# Patient Record
Sex: Female | Born: 1970
Health system: Southern US, Community
[De-identification: ages and names within clinical notes are randomized; demographics above are authoritative.]

---

## 1999-04-19 ENCOUNTER — Ambulatory Visit (HOSPITAL_BASED_OUTPATIENT_CLINIC_OR_DEPARTMENT_OTHER): Admission: RE | Admit: 1999-04-19 | Discharge: 1999-04-19 | Payer: Self-pay | Admitting: Urology

## 1999-12-03 ENCOUNTER — Ambulatory Visit (HOSPITAL_COMMUNITY): Admission: RE | Admit: 1999-12-03 | Discharge: 1999-12-03 | Payer: Self-pay | Admitting: Family Medicine

## 1999-12-03 ENCOUNTER — Encounter: Payer: Self-pay | Admitting: Family Medicine

## 2000-02-19 ENCOUNTER — Encounter (INDEPENDENT_AMBULATORY_CARE_PROVIDER_SITE_OTHER): Payer: Self-pay | Admitting: Specialist

## 2000-02-19 ENCOUNTER — Other Ambulatory Visit: Admission: RE | Admit: 2000-02-19 | Discharge: 2000-02-19 | Payer: Self-pay | Admitting: Obstetrics and Gynecology

## 2001-02-18 ENCOUNTER — Other Ambulatory Visit: Admission: RE | Admit: 2001-02-18 | Discharge: 2001-02-18 | Payer: Self-pay | Admitting: Obstetrics and Gynecology

## 2002-02-22 ENCOUNTER — Other Ambulatory Visit: Admission: RE | Admit: 2002-02-22 | Discharge: 2002-02-22 | Payer: Self-pay | Admitting: Obstetrics and Gynecology

## 2003-02-27 ENCOUNTER — Other Ambulatory Visit: Admission: RE | Admit: 2003-02-27 | Discharge: 2003-02-27 | Payer: Self-pay | Admitting: Obstetrics and Gynecology

## 2003-06-08 ENCOUNTER — Other Ambulatory Visit: Admission: RE | Admit: 2003-06-08 | Discharge: 2003-06-08 | Payer: Self-pay | Admitting: Obstetrics and Gynecology

## 2003-12-13 ENCOUNTER — Inpatient Hospital Stay (HOSPITAL_COMMUNITY): Admission: AD | Admit: 2003-12-13 | Discharge: 2003-12-16 | Payer: Self-pay | Admitting: Obstetrics and Gynecology

## 2003-12-17 ENCOUNTER — Encounter: Admission: RE | Admit: 2003-12-17 | Discharge: 2004-01-16 | Payer: Self-pay | Admitting: Obstetrics and Gynecology

## 2004-01-17 ENCOUNTER — Other Ambulatory Visit: Admission: RE | Admit: 2004-01-17 | Discharge: 2004-01-17 | Payer: Self-pay | Admitting: Obstetrics and Gynecology

## 2004-01-17 ENCOUNTER — Encounter: Admission: RE | Admit: 2004-01-17 | Discharge: 2004-02-16 | Payer: Self-pay | Admitting: Obstetrics and Gynecology

## 2005-02-10 ENCOUNTER — Other Ambulatory Visit: Admission: RE | Admit: 2005-02-10 | Discharge: 2005-02-10 | Payer: Self-pay | Admitting: Obstetrics and Gynecology

## 2008-02-17 ENCOUNTER — Ambulatory Visit: Payer: Self-pay | Admitting: Sports Medicine

## 2008-02-17 DIAGNOSIS — M25559 Pain in unspecified hip: Secondary | ICD-10-CM | POA: Insufficient documentation

## 2008-06-24 ENCOUNTER — Ambulatory Visit: Payer: Self-pay | Admitting: Family Medicine

## 2008-06-24 DIAGNOSIS — J0100 Acute maxillary sinusitis, unspecified: Secondary | ICD-10-CM | POA: Insufficient documentation

## 2008-09-05 ENCOUNTER — Ambulatory Visit: Payer: Self-pay | Admitting: Family Medicine

## 2008-09-05 ENCOUNTER — Telehealth: Payer: Self-pay | Admitting: Family Medicine

## 2008-09-05 DIAGNOSIS — J329 Chronic sinusitis, unspecified: Secondary | ICD-10-CM | POA: Insufficient documentation

## 2009-01-04 ENCOUNTER — Ambulatory Visit: Payer: Self-pay | Admitting: Family Medicine

## 2009-01-04 DIAGNOSIS — J069 Acute upper respiratory infection, unspecified: Secondary | ICD-10-CM | POA: Insufficient documentation

## 2009-01-09 ENCOUNTER — Ambulatory Visit: Payer: Self-pay | Admitting: Family Medicine

## 2010-10-04 NOTE — Discharge Summary (Signed)
NAMETowanda Callahan                         ACCOUNT NO.:  [id number]   MEDICAL RECORD NO.:  [id number]                   PATIENT TYPE:  INP   LOCATION:  9129                                 FACILITY:  [facility]   PHYSICIAN:  Guy Sandifer. Arleta Creek, M.D.           DATE OF BIRTH:  18-May-1971   DATE OF ADMISSION:  12/13/2003  DATE OF DISCHARGE:  12/16/2003                                 DISCHARGE SUMMARY   ADMISSION DIAGNOSIS:  Intrauterine pregnancy at 39-1/7 weeks.   DISCHARGE DIAGNOSES:  1. Intrauterine pregnancy at 39-1/7 weeks.  2. Failure to progress.   PROCEDURE:  On December 13, 2003, low transverse Cesarean section.   REASON FOR ADMISSION:  The patient is a 40 year old married white female,  G1, P0, admitted with rupture of membranes. She progresses to 3 cm and 75%  effaced.   HOSPITAL COURSE:  The patient was taken to the operating room and undergoes  the above procedure for a viable female infant. On the first postoperative  day, she was afebrile, vital signs were stable. Hemoglobin 11.5, white count  14.3. She had good progression of bowel function, ambulation, and voiding.  She remained afebrile with stable vital signs.   CONDITION ON DISCHARGE:  Good.   DIET:  Regular as tolerated.   ACTIVITY:  No lifting; no operation of automobiles; no vaginal entry.   FOLLOWUP:  She is to call the office for problems including but not limited  to temperature of 101 degrees, persistent nausea and vomiting, heavy vaginal  bleeding, or increasing pain. Follow up in the office in two weeks.   MEDICATIONS:  1. Percocet 5/325 mg, #30, one to two p.o. q.6h p.r.n.  2. Ibuprofen 600 mg q.6h. p.r.n.  3. Vitamin daily.                                               Guy Sandifer Arleta Creek, M.D.    JET/MEDQ  D:  12/16/2003  T:  12/16/2003  Job:  [id number]

## 2010-10-04 NOTE — Op Note (Signed)
NAMETowanda Callahan                         ACCOUNT NO.:  [id number]   MEDICAL RECORD NO.:  [id number]                   PATIENT TYPE:  INP   LOCATION:  9129                                 FACILITY:  [facility]   PHYSICIAN:  Dineen Kid. Rana Snare, M.D.                 DATE OF BIRTH:  10-Apr-1971   DATE OF PROCEDURE:  12/14/2003  DATE OF DISCHARGE:                                 OPERATIVE REPORT   PREOPERATIVE DIAGNOSIS:  Intrauterine pregnancy at 39 weeks with failure to  progress.   POSTOPERATIVE DIAGNOSES:  1. Intrauterine pregnancy at 39 weeks with failure to progress.  2. Occiput posterior presentation.   PROCEDURE:  Primary low segment transverse cesarean section.   SURGEON:  Dineen Kid. Rana Snare, M.D.   ANESTHESIA:  Epidural.   ESTIMATED BLOOD LOSS:  800 mL.   INDICATIONS:  [name] is a 40 year old G1 who presented at 39-1/2 weeks  for induction of labor due to anxiety regarding a large baby.  She had an  ultrasound last week that showed a 7-1/2 pound fetus.  Pregnancy has  otherwise been uncomplicated.  She does have a history of HSV.  Last  outbreak was five weeks ago.  No recent symptoms, and exam upon presentation  was without evidence of active lesions.   HOSPITAL COURSE:  The patient underwent amniotomy and Pitocin induction of  labor.  She progressed to 3 cm and 75%.  Despite adequate labor for greater  than four hours, she never progressed beyond 3 cm, and we proceeded with  primary low segment transverse cesarean section for failure to progress.  The risks and benefits were discussed and formal consent was obtained.   Findings at the time of surgery were a viable female infant, Apgars were 9  and 9, weight is 8 pounds 3 ounces, pH arterial is currently pending.   DESCRIPTION OF PROCEDURE:  After adequate analgesia, the patient was placed  in the supine position with a left lateral tilt.  She was sterilely prepped  and draped.  The bladder was sterilely drained with a  Foley catheter.  A  Pfannenstiel skin incision was made two fingerbreadths above the pubic  symphysis, taken down sharply to the fascia, which was incised transversely,  extended superiorly and inferiorly off the bellies of the rectus muscle,  which were separated sharply in the midline.  The peritoneum was entered  sharply, a bladder flap was created, placed behind the bladder blade.  A low  segment myotomy incision was made down to the infant's vertex, extended  laterally with the operator's fingertips.  The infant's vertex was delivered  atraumatically, the nares and pharynx suctioned.  The infant was then  delivered, cord clamped, cut, handed to the pediatricians for resuscitation.  Cord blood was obtained, placenta extracted manually.  The uterus was  exteriorized, wiped clean with a dry lap.  The myotomy incision was closed  with two  sutures of 0 Monocryl, the first being a running locking layer, the  second being an imbricating layer, with good hemostasis achieved.  The  uterus was placed back in the peritoneal cavity.  After a copious amount of  irrigation and adequate hemostasis was assured, the peritoneum was closed  with 0 Monocryl, rectus muscle plicated in the midline, irrigation was  applied.  After adequate hemostasis, the fascia was closed with #1 Vicryl in  a running fashion.  Irrigation of the skin applied and after adequate  hemostasis, the skin was stapled, Steri-Strips applied.  The patient  tolerated the procedure well, was stable on transfer to the recovery room.  The sponge and instrument count was normal x3.  The estimated blood loss for  the procedure was 800 mL.  The patient received Rocephin 1 g after delivery  of the placenta.                                               Dineen Kid Rana Snare, M.D.    DCL/MEDQ  D:  12/14/2003  T:  12/14/2003  Job:  [id number]

## 2014-03-16 ENCOUNTER — Emergency Department (HOSPITAL_COMMUNITY)
Admission: EM | Admit: 2014-03-16 | Discharge: 2014-03-16 | Disposition: A | Discharge status: Home / Self Care | Payer: Worker's Compensation | Attending: Emergency Medicine | Admitting: Emergency Medicine

## 2014-03-16 ENCOUNTER — Encounter (HOSPITAL_COMMUNITY): Payer: Self-pay | Admitting: Emergency Medicine

## 2014-03-16 DIAGNOSIS — Y9289 Other specified places as the place of occurrence of the external cause: Secondary | ICD-10-CM | POA: Insufficient documentation

## 2014-03-16 DIAGNOSIS — Z79899 Other long term (current) drug therapy: Secondary | ICD-10-CM | POA: Diagnosis not present

## 2014-03-16 DIAGNOSIS — X58XXXA Exposure to other specified factors, initial encounter: Secondary | ICD-10-CM | POA: Insufficient documentation

## 2014-03-16 DIAGNOSIS — S86911A Strain of unspecified muscle(s) and tendon(s) at lower leg level, right leg, initial encounter: Secondary | ICD-10-CM | POA: Insufficient documentation

## 2014-03-16 DIAGNOSIS — S8991XA Unspecified injury of right lower leg, initial encounter: Secondary | ICD-10-CM | POA: Diagnosis present

## 2014-03-16 DIAGNOSIS — Z7951 Long term (current) use of inhaled steroids: Secondary | ICD-10-CM | POA: Insufficient documentation

## 2014-03-16 DIAGNOSIS — S99911A Unspecified injury of right ankle, initial encounter: Secondary | ICD-10-CM | POA: Insufficient documentation

## 2014-03-16 DIAGNOSIS — Y9301 Activity, walking, marching and hiking: Secondary | ICD-10-CM | POA: Diagnosis not present

## 2014-03-16 DIAGNOSIS — S86811A Strain of other muscle(s) and tendon(s) at lower leg level, right leg, initial encounter: Secondary | ICD-10-CM

## 2014-03-16 MED ORDER — HYDROCODONE-ACETAMINOPHEN 5-325 MG PO TABS: 1.0000 | ORAL_TABLET | Freq: Four times a day (QID) | ORAL | Status: DC | PRN

## 2014-03-16 MED ORDER — HYDROCODONE-ACETAMINOPHEN 5-325 MG PO TABS
2.0000 | ORAL_TABLET | Freq: Once | ORAL | Status: AC
  Administered 2014-03-16: 2 via ORAL
  Filled 2014-03-16: qty 2

## 2014-03-16 MED ORDER — IBUPROFEN 800 MG PO TABS: 800.0000 mg | ORAL_TABLET | Freq: Three times a day (TID) | ORAL | Status: DC

## 2014-03-16 MED ORDER — IBUPROFEN 800 MG PO TABS
800.0000 mg | ORAL_TABLET | Freq: Once | ORAL | Status: AC
  Administered 2014-03-16: 800 mg via ORAL
  Filled 2014-03-16: qty 1

## 2014-03-16 NOTE — ED Notes (Signed)
AVS explained in detail. Knows not to drink/drive/operate heavy machinery with [employer]. No other questions/concerns. Acknowledges understanding to follow up with orthopedics in one week. Ace wrap intact. Knows appropriate use of crutches.

## 2014-03-16 NOTE — ED Notes (Signed)
Pt from work. Per EMS pt was walking backward and got imbalanced which cause fall and right leg injury. Pt presents with calf edema and pain. Pt denies syncope or head injury.

## 2014-03-16 NOTE — Discharge Instructions (Signed)
Medial Head Gastrocnemius Tear (Tennis Leg), with Rehab Medial head gastrocnemius tear, also called tennis leg, is a tear (strain) in a muscle or tendon of the inner portion (medial head) of one of the calf muscles (gastrocnemius). The inner portion of the calf muscle attaches to the thigh bone (femur) and is responsible for bending the knee and straightening the foot (standing "on tiptoe"). Strains are classified into three categories. Grade 1 strains cause pain, but the tendon is not lengthened. Grade 2 strains include a lengthened ligament, due to the ligament being stretched or partially ruptured. With grade 2 strains there is still function, although function may be decreased. Grade 3 strains involve a complete tear of the tendon or muscle, and function is usually impaired. SYMPTOMS   Sudden "pop" or tear felt at the time of injury.  Pain, tenderness, swelling, warmth, or redness over the middle inner calf.  Pain and weakness with ankle motion, especially flexing the ankle against resistance, as well as pain with lifting up the foot (extending the ankle).  Bruising (contusion) of the calf, heel, and sometimes the foot within 48 hours of injury.  Muscle spasm in the calf. CAUSES  Muscle and ligament strains occur when a force is placed on the muscle or ligament that is greater than it can handle. Common causes of injury include:  Direct hit (trauma) to the calf.  Sudden forceful pushing off or landing on the foot (jumping, landing, serving a tennis ball, lunging). RISK INCREASES WITH:  Sports that require sudden, explosive calf muscle contraction, such as those involving jumping (basketball), hill running, quick starts (running), or lunging (racquetball, tennis).  Contact sports (football, soccer, hockey).  Poor strength and flexibility.  Previous lower limb injury. PREVENTION  Warm up and stretch properly before activity.  Allow for adequate recovery between workouts.  Maintain  physical fitness:  Strength, flexibility, and endurance.  Cardiovascular fitness.  Learn and use proper exercise technique.  Complete rehabilitation after lower limb injury, before returning to competition or practice. PROGNOSIS  If treated properly, tennis leg usually heals within 6 weeks of nonsurgical treatment.  RELATED COMPLICATIONS   Longer healing time, if not properly treated or if not given enough time to heal.  Recurring symptoms and injury, if activity is resumed too soon, with overuse, with a direct blow, or with poor technique.  If untreated, may progress to a complete tear (rare) or other injury, due to limping and favoring of the injured leg.  Persistent limping, due to scarring and shortening of the calf muscles, as a result of inadequate rehabilitation.  Prolonged disability. TREATMENT  Treatment first involves the use of ice and medication to help reduce pain and inflammation. The use of strengthening and stretching exercises may help reduce pain with activity. These exercises may be performed at home or with a therapist. For severe injuries, referral to a therapist may be needed for further evaluation and treatment. Your caregiver may advise that you wear a brace to help healing. Sometimes, crutches are needed until you can walk without limping. Rarely, surgery is needed.  MEDICATION   If pain medicine is needed, nonsteroidal anti-inflammatory medicines (aspirin and ibuprofen), or other minor pain relievers (acetaminophen), are often advised.  Do not take pain medicine for 7 days before surgery.  Prescription pain relievers may be given, if your caregiver thinks they are needed. Use only as directed and only as much as you need. HEAT AND COLD  Cold treatment (icing) should be applied for 10 to 15  minutes every 2 to 3 hours for inflammation and pain, and immediately after activity that aggravates your symptoms. Use ice packs or an ice massage.  Heat treatment may  be used before performing stretching and strengthening activities prescribed by your caregiver, physical therapist, or athletic trainer. Use a heat pack or a warm water soak. SEEK MEDICAL CARE IF:   Symptoms get worse or do not improve in 2 weeks, despite treatment.  Numbness or tingling develops.  New, unexplained symptoms develop. (Drugs used in treatment may produce side effects.) EXERCISES  RANGE OF MOTION (ROM) AND STRETCHING EXERCISES - Medial Head Gastrocnemius Tear (Tennis Leg) These exercises may help you when beginning to rehabilitate your injury. Your symptoms may resolve with or without further involvement from your physician, physical therapist, or athletic trainer. While completing these exercises, remember:   Restoring tissue flexibility helps normal motion to return to the joints. This allows healthier, less painful movement and activity.  An effective stretch should be held for at least 30 seconds.  A stretch should never be painful. You should only feel a gentle lengthening or release in the stretched tissue. STRETCH - Gastrocsoleus  Sit with your right / left leg extended. Holding onto both ends of a belt or towel, loop it around the ball of your foot.  Keeping your right / left ankle and foot relaxed and your knee straight, pull your foot and ankle toward you using the belt.  You should feel a gentle stretch behind your calf or knee. Hold this position for __________ seconds. Repeat __________ times. Complete this stretch __________ times per day.  RANGE OF MOTION - Ankle Dorsiflexion, Active Assisted   Remove your shoes and sit on a chair, preferably not on a carpeted surface.  Place your right / left foot directly under the knee. Extend your opposite leg for support.  Keeping your heel down, slide your right / left foot back toward the chair, until you feel a stretch at your ankle or calf. If you do not feel a stretch, slide your bottom forward to the edge of the  chair, while still keeping your heel down.  Hold this stretch for __________ seconds. Repeat __________ times. Complete this stretch __________ times per day.  STRETCH - Gastroc, Standing   Place your hands on a wall.  Extend your right / left leg behind you, keeping the front knee somewhat bent.  Slightly point your toes inward on your back foot.  Keeping your right / left heel on the floor and your knee straight, shift your weight toward the wall, not allowing your back to arch.  You should feel a gentle stretch in the right / left calf. Hold this position for __________ seconds. Repeat __________ times. Complete this stretch __________ times per day. STRETCH - Soleus, Standing   Place your hands on a wall.  Extend your right / left leg behind you, keeping the other knee somewhat bent.  Point your toes of your back foot slightly inward.  Keep your right / left heel on the floor, bend your back knee, and slightly shift your weight over the back leg so that you feel a gentle stretch deep in your back calf.  Hold this position for __________ seconds. Repeat __________ times. Complete this stretch __________ times per day. STRETCH - Gastrocsoleus, Standing Note: This exercise can place a lot of stress on your foot and ankle. Please complete this exercise only if specifically instructed by your caregiver.   Place the ball of  your right / left foot on a step, keeping your other foot firmly on the same step.  Hold on to the wall or a rail for balance.  Slowly lift your other foot, allowing your body weight to press your heel down over the edge of the step.  You should feel a stretch in your right / left calf.  Hold this position for __________ seconds.  Repeat this exercise with a slight bend in your right / left knee. Repeat __________ times. Complete this stretch __________ times per day.  STRENGTHENING EXERCISES - Medial Head Gastrocnemius Tear (Tennis Leg) These exercises  may help you when beginning to rehabilitate your injury. They may resolve your symptoms with or without further involvement from your physician, physical therapist, or athletic trainer. While completing these exercises, remember:   Muscles can gain both the endurance and the strength needed for everyday activities through controlled exercises.  Complete these exercises as instructed by your physician, physical therapist, or athletic trainer. Increase the resistance and repetitions only as guided by your caregiver. STRENGTH - Plantar-flexors  Sit with your right / left leg extended. Holding onto both ends of a rubber exercise band or tubing, loop it around the ball of your foot. Keep a slight tension in the band.  Slowly push your toes away from you, pointing them downward.  Hold this position for __________ seconds. Return slowly, controlling the tension in the band. Repeat __________ times. Complete this exercise __________ times per day.  STRENGTH - Plantar-flexors  Stand with your feet shoulder width apart. Steady yourself with a wall or table, using as little support as needed.  Keeping your weight evenly spread over the width of your feet, rise up on your toes.*  Hold this position for __________ seconds. Repeat __________ times. Complete this exercise __________ times per day.  *If this is too easy, shift your weight toward your right / left leg until you feel challenged. Ultimately, you may be asked to do this exercise while standing on your right / left foot only. STRENGTH - Plantar-flexors, Eccentric Note: This exercise can place a lot of stress on your foot and ankle. Please complete this exercise only if specifically instructed by your caregiver.   Place the balls of your feet on a step. With your hands, use only enough support from a wall or rail to keep your balance.  Keep your knees straight and rise up on your toes.  Slowly shift your weight entirely to your right / left  toes and pick up your opposite foot. Gently and with controlled movement, lower your weight through your right / left foot so that your heel drops below the level of the step. You will feel a slight stretch in the back of your right / left calf.  Use the healthy leg to help rise up onto the balls of both feet, then lower weight only onto the right / left leg again. Build up to 15 repetitions. Then progress to 3 sets of 15 repetitions.*  After completing the above exercise, complete the same exercise with a slight knee bend (about 30 degrees). Again, build up to 15 repetitions. Then progress to 3 sets of 15 repetitions.* Perform this exercise __________ times per day.  *When you easily complete 3 sets of 15, your physician, physical therapist, or athletic trainer may advise you to add resistance, by wearing a backpack filled with additional weight. Document Released: 05/05/2005 Document Revised: 09/19/2013 Document Reviewed: 08/17/2008 [facility] Patient Information 2015 [place], [place]. This  information is not intended to replace advice given to you by your health care provider. Make sure you discuss any questions you have with your health care provider.  Ligament Sprain A ligament sprain is when the bands of tissue that hold bones together (ligament) are stretched. HOME CARE   Rest the injured area.  Start using the joint when told to by your doctor.  Keep the injured area raised (elevated) above the level of the heart. This may lessen puffiness (swelling).  Put ice on the injured area.  Put ice in a plastic bag.  Place a towel between your skin and the bag.  Leave the ice on for 15-20 minutes, 03-04 times a day.  Wear a splint, cast, or an elastic bandage as told by your doctor.  Only take medicine as told by your doctor.  Use crutches as told by your doctor. Do not put weight on the injured joint until told to by your doctor. GET HELP RIGHT AWAY IF:   You have more bruising,  puffiness, or pain.  The leg was injured and the toes are cold, tingling, numb, or blue.  The arm was injured and the fingers are cold, tingling, numb, or blue.  The pain is not helped with medicine.  The pain gets worse. MAKE SURE YOU:   Understand these instructions.  Will watch this condition.  Will get help right away if you are not doing well or get worse. Document Released: 10/22/2007 Document Revised: 02/23/2013 Document Reviewed: 10/22/2007 [facility] Patient Information 2015 [place], [place]. This information is not intended to replace advice given to you by your health care provider. Make sure you discuss any questions you have with your health care provider.  Muscle Strain A muscle strain is an injury that occurs when a muscle is stretched beyond its normal length. Usually a small number of muscle fibers are torn when this happens. Muscle strain is rated in degrees. First-degree strains have the least amount of muscle fiber tearing and pain. Second-degree and third-degree strains have increasingly more tearing and pain.  Usually, recovery from muscle strain takes 1-2 weeks. Complete healing takes 5-6 weeks.  CAUSES  Muscle strain happens when a sudden, violent force placed on a muscle stretches it too far. This may occur with lifting, sports, or a fall.  RISK FACTORS Muscle strain is especially common in athletes.  SIGNS AND SYMPTOMS At the site of the muscle strain, there may be:  Pain.  Bruising.  Swelling.  Difficulty using the muscle due to pain or lack of normal function. DIAGNOSIS  Your health care provider will perform a physical exam and ask about your medical history. TREATMENT  Often, the best treatment for a muscle strain is resting, icing, and applying cold compresses to the injured area.  HOME CARE INSTRUCTIONS   Use the PRICE method of treatment to promote muscle healing during the first 2-3 days after your injury. The PRICE method  involves:  Protecting the muscle from being injured again.  Restricting your activity and resting the injured body part.  Icing your injury. To do this, put ice in a plastic bag. Place a towel between your skin and the bag. Then, apply the ice and leave it on from 15-20 minutes each hour. After the third day, switch to moist heat packs.  Apply compression to the injured area with a splint or elastic bandage. Be careful not to wrap it too tightly. This may interfere with blood circulation or increase swelling.  Elevate the  injured body part above the level of your heart as often as you can.  Only take over-the-counter or prescription medicines for pain, discomfort, or fever as directed by your health care provider.  Warming up prior to exercise helps to prevent future muscle strains. SEEK MEDICAL CARE IF:   You have increasing pain or swelling in the injured area.  You have numbness, tingling, or a significant loss of strength in the injured area. MAKE SURE YOU:   Understand these instructions.  Will watch your condition.  Will get help right away if you are not doing well or get worse. Document Released: 05/05/2005 Document Revised: 02/23/2013 Document Reviewed: 12/02/2012 [facility] Patient Information 2015 [place], [place]. This information is not intended to replace advice given to you by your health care provider. Make sure you discuss any questions you have with your health care provider.

## 2014-03-16 NOTE — ED Provider Notes (Signed)
CSN: [id number]     Arrival date & time 03/16/14  1417 History   First MD Initiated Contact with Patient 03/16/14 1457     Chief Complaint  Patient presents with  . Fall    right leg injury     (Consider location/radiation/quality/duration/timing/severity/associated sxs/prior Treatment) HPI Comments: Walking backwards and felt a pop in her R calf. She then fell to the ground. Having severe upper R calf pain in the musculature.  Patient is a 43 y.o. female presenting with fall.  Fall This is a new problem. The current episode started 1 to 2 hours ago. The problem occurs constantly. The problem has not changed since onset.Pertinent negatives include no chest pain, no abdominal pain and no shortness of breath. Nothing aggravates the symptoms. Nothing relieves the symptoms. She has tried nothing for the symptoms.    History reviewed. No pertinent past medical history. No past surgical history on file. No family history on file. History  Substance Use Topics  . Smoking status: Not on file  . Smokeless tobacco: Not on file  . Alcohol Use: Not on file   OB History   Grav Para Term Preterm Abortions TAB SAB Ect Mult Living                 Review of Systems  Constitutional: Negative for fever and chills.  Respiratory: Negative for cough and shortness of breath.   Cardiovascular: Negative for chest pain and leg swelling.  Gastrointestinal: Negative for vomiting and abdominal pain.  All other systems reviewed and are negative.     Allergies  Metronidazole  Home Medications   Prior to Admission medications   Medication Sig Start Date End Date Taking? Authorizing Provider  B Complex-C (SUPER B COMPLEX PO) Take 1 tablet by mouth every evening.   Yes Historical Provider, MD  Fexofenadine HCl (ALLEGRA PO) Take 1 tablet by mouth every evening.   Yes Historical Provider, MD  mometasone (NASONEX) 50 MCG/ACT nasal spray Place 1-2 sprays into the nose every evening.   Yes Historical  Provider, MD  Multiple Vitamin (MULTIVITAMIN) tablet Take 1 tablet by mouth daily.     Yes Historical Provider, MD  Norethindrone Acet-Ethinyl Est (LOESTRIN 1.5/30, 21,) 1.5-30 MG-MCG TABS Take 1 tablet by mouth every evening.    Yes Historical Provider, MD  sertraline (ZOLOFT) 100 MG tablet Take 150 mg by mouth at bedtime.    Yes Historical Provider, MD  valACYclovir (VALTREX) 1000 MG tablet Take 1,000 mg by mouth daily as needed.   Yes Historical Provider, MD   BP 125/61  Pulse 85  Temp(Src) 98.5 F (36.9 C) (Oral)  Resp 20  SpO2 98%  LMP 02/14/2014 Physical Exam  Nursing note and vitals reviewed. Constitutional: She is oriented to person, place, and time. She appears well-developed and well-nourished. No distress.  HENT:  Head: Normocephalic and atraumatic.  Mouth/Throat: Oropharynx is clear and moist.  Eyes: EOM are normal. Pupils are equal, round, and reactive to light.  Neck: Normal range of motion. Neck supple.  Cardiovascular: Normal rate and regular rhythm.  Exam reveals no friction rub.   No murmur heard. Pulmonary/Chest: Effort normal and breath sounds normal. No respiratory distress. She has no wheezes. She has no rales.  Abdominal: Soft. She exhibits no distension. There is no tenderness. There is no rebound.  Musculoskeletal: She exhibits no edema.       Right ankle: She exhibits decreased range of motion (due to calf pain). She exhibits no swelling, no ecchymosis,  no deformity, no laceration and normal pulse. No tenderness.       Right lower leg: She exhibits tenderness. She exhibits no bony tenderness, no swelling, no edema, no deformity and no laceration.  Neurological: She is alert and oriented to person, place, and time.  Skin: No rash noted. She is not diaphoretic.    ED Course  Procedures (including critical care time) Labs Review Labs Reviewed - No data to display  Imaging Review No results found.   EKG Interpretation None      MDM   Final  diagnoses:  Strain of calf muscle, right, initial encounter    56F here with symptoms concerning for right calf strain versus partial tear. She was walking backwards and felt a pop in her calf. She then fell to the ground. She did not hit her head or lose consciousness. Vitals are stable here. She has diffuse upper right calf musculature tenderness. She has noted mild decreased range of motion her right ankle due to the pain. She had negative Thompson test and no pain over the Achilles tendon. I believe her symptoms are consistent with calf strain. She will be unable to bear weight so will provide crutches. Also provide a compression sleeve. Counseled her on NSAIDs, follow up with physical therapy and orthopedics. Given small amount of pain medicine. Stable for discharge.    Elwin MochaBlair Macall Mccroskey, MD 03/16/14 [phone]

## 2014-03-16 NOTE — ED Notes (Signed)
Bed: [facility] Expected date:  Expected time:  Means of arrival:  Comments: 

## 2016-04-03 ENCOUNTER — Other Ambulatory Visit: Payer: Self-pay | Admitting: Orthopedic Surgery

## 2016-04-03 DIAGNOSIS — M533 Sacrococcygeal disorders, not elsewhere classified: Secondary | ICD-10-CM

## 2016-04-16 ENCOUNTER — Other Ambulatory Visit: Payer: Self-pay

## 2017-08-18 ENCOUNTER — Other Ambulatory Visit: Payer: Self-pay | Admitting: Obstetrics and Gynecology

## 2017-08-18 DIAGNOSIS — R928 Other abnormal and inconclusive findings on diagnostic imaging of breast: Secondary | ICD-10-CM

## 2017-08-21 ENCOUNTER — Ambulatory Visit
Admission: RE | Admit: 2017-08-21 | Discharge: 2017-08-21 | Disposition: A | Discharge status: Home / Self Care | Payer: 59 | Source: Ambulatory Visit | Attending: Obstetrics and Gynecology | Admitting: Obstetrics and Gynecology

## 2017-08-21 ENCOUNTER — Other Ambulatory Visit: Payer: Self-pay | Admitting: Obstetrics and Gynecology

## 2017-08-21 DIAGNOSIS — R921 Mammographic calcification found on diagnostic imaging of breast: Secondary | ICD-10-CM

## 2017-08-21 DIAGNOSIS — R928 Other abnormal and inconclusive findings on diagnostic imaging of breast: Secondary | ICD-10-CM

## 2018-02-23 ENCOUNTER — Ambulatory Visit
Admission: RE | Admit: 2018-02-23 | Discharge: 2018-02-23 | Disposition: A | Discharge status: Home / Self Care | Payer: BLUE CROSS/BLUE SHIELD | Source: Ambulatory Visit | Attending: Obstetrics and Gynecology | Admitting: Obstetrics and Gynecology

## 2018-02-23 ENCOUNTER — Other Ambulatory Visit: Payer: Self-pay | Admitting: Obstetrics and Gynecology

## 2018-02-23 DIAGNOSIS — R921 Mammographic calcification found on diagnostic imaging of breast: Secondary | ICD-10-CM | POA: Diagnosis not present

## 2018-02-23 DIAGNOSIS — Z23 Encounter for immunization: Secondary | ICD-10-CM | POA: Diagnosis not present

## 2018-04-09 DIAGNOSIS — L02213 Cutaneous abscess of chest wall: Secondary | ICD-10-CM | POA: Diagnosis not present

## 2018-09-02 ENCOUNTER — Other Ambulatory Visit: Payer: Self-pay

## 2018-09-02 ENCOUNTER — Ambulatory Visit
Admission: RE | Admit: 2018-09-02 | Discharge: 2018-09-02 | Disposition: A | Discharge status: Home / Self Care | Payer: BC Managed Care – PPO | Source: Ambulatory Visit | Attending: Obstetrics and Gynecology | Admitting: Obstetrics and Gynecology

## 2018-09-02 DIAGNOSIS — R921 Mammographic calcification found on diagnostic imaging of breast: Secondary | ICD-10-CM

## 2019-01-03 DIAGNOSIS — Z6831 Body mass index (BMI) 31.0-31.9, adult: Secondary | ICD-10-CM | POA: Diagnosis not present

## 2019-01-03 DIAGNOSIS — Z01419 Encounter for gynecological examination (general) (routine) without abnormal findings: Secondary | ICD-10-CM | POA: Diagnosis not present

## 2019-04-05 DIAGNOSIS — Z20828 Contact with and (suspected) exposure to other viral communicable diseases: Secondary | ICD-10-CM | POA: Diagnosis not present

## 2019-05-18 DIAGNOSIS — Z0000 Encounter for general adult medical examination without abnormal findings: Secondary | ICD-10-CM | POA: Diagnosis not present

## 2019-05-26 DIAGNOSIS — E7800 Pure hypercholesterolemia, unspecified: Secondary | ICD-10-CM | POA: Diagnosis not present

## 2019-05-26 DIAGNOSIS — Z23 Encounter for immunization: Secondary | ICD-10-CM | POA: Diagnosis not present

## 2019-10-26 ENCOUNTER — Other Ambulatory Visit: Payer: Self-pay | Admitting: Obstetrics and Gynecology

## 2019-10-26 DIAGNOSIS — R921 Mammographic calcification found on diagnostic imaging of breast: Secondary | ICD-10-CM

## 2019-11-23 DIAGNOSIS — D2261 Melanocytic nevi of right upper limb, including shoulder: Secondary | ICD-10-CM | POA: Diagnosis not present

## 2019-11-23 DIAGNOSIS — D485 Neoplasm of uncertain behavior of skin: Secondary | ICD-10-CM | POA: Diagnosis not present

## 2019-11-23 DIAGNOSIS — D2271 Melanocytic nevi of right lower limb, including hip: Secondary | ICD-10-CM | POA: Diagnosis not present

## 2019-11-23 DIAGNOSIS — D2262 Melanocytic nevi of left upper limb, including shoulder: Secondary | ICD-10-CM | POA: Diagnosis not present

## 2019-11-23 DIAGNOSIS — D225 Melanocytic nevi of trunk: Secondary | ICD-10-CM | POA: Diagnosis not present

## 2019-11-24 ENCOUNTER — Ambulatory Visit
Admission: RE | Admit: 2019-11-24 | Discharge: 2019-11-24 | Disposition: A | Discharge status: Home / Self Care | Payer: BC Managed Care – PPO | Source: Ambulatory Visit | Attending: Obstetrics and Gynecology | Admitting: Obstetrics and Gynecology

## 2019-11-24 ENCOUNTER — Other Ambulatory Visit: Payer: Self-pay

## 2019-11-24 DIAGNOSIS — R921 Mammographic calcification found on diagnostic imaging of breast: Secondary | ICD-10-CM

## 2020-01-03 DIAGNOSIS — Z01419 Encounter for gynecological examination (general) (routine) without abnormal findings: Secondary | ICD-10-CM | POA: Diagnosis not present

## 2020-01-03 DIAGNOSIS — Z6833 Body mass index (BMI) 33.0-33.9, adult: Secondary | ICD-10-CM | POA: Diagnosis not present

## 2020-01-03 DIAGNOSIS — L259 Unspecified contact dermatitis, unspecified cause: Secondary | ICD-10-CM | POA: Diagnosis not present

## 2020-01-03 DIAGNOSIS — F419 Anxiety disorder, unspecified: Secondary | ICD-10-CM | POA: Diagnosis not present

## 2020-03-09 DIAGNOSIS — Z23 Encounter for immunization: Secondary | ICD-10-CM | POA: Diagnosis not present

## 2020-03-29 DIAGNOSIS — Z03818 Encounter for observation for suspected exposure to other biological agents ruled out: Secondary | ICD-10-CM | POA: Diagnosis not present

## 2020-03-29 DIAGNOSIS — Z20822 Contact with and (suspected) exposure to covid-19: Secondary | ICD-10-CM | POA: Diagnosis not present

## 2020-12-11 DIAGNOSIS — M9903 Segmental and somatic dysfunction of lumbar region: Secondary | ICD-10-CM | POA: Diagnosis not present

## 2020-12-11 DIAGNOSIS — M531 Cervicobrachial syndrome: Secondary | ICD-10-CM | POA: Diagnosis not present

## 2020-12-11 DIAGNOSIS — M5417 Radiculopathy, lumbosacral region: Secondary | ICD-10-CM | POA: Diagnosis not present

## 2020-12-11 DIAGNOSIS — M9905 Segmental and somatic dysfunction of pelvic region: Secondary | ICD-10-CM | POA: Diagnosis not present

## 2020-12-13 DIAGNOSIS — M9905 Segmental and somatic dysfunction of pelvic region: Secondary | ICD-10-CM | POA: Diagnosis not present

## 2020-12-13 DIAGNOSIS — M531 Cervicobrachial syndrome: Secondary | ICD-10-CM | POA: Diagnosis not present

## 2020-12-13 DIAGNOSIS — M9903 Segmental and somatic dysfunction of lumbar region: Secondary | ICD-10-CM | POA: Diagnosis not present

## 2020-12-13 DIAGNOSIS — M5417 Radiculopathy, lumbosacral region: Secondary | ICD-10-CM | POA: Diagnosis not present

## 2020-12-17 DIAGNOSIS — M9905 Segmental and somatic dysfunction of pelvic region: Secondary | ICD-10-CM | POA: Diagnosis not present

## 2020-12-17 DIAGNOSIS — M5417 Radiculopathy, lumbosacral region: Secondary | ICD-10-CM | POA: Diagnosis not present

## 2020-12-17 DIAGNOSIS — M531 Cervicobrachial syndrome: Secondary | ICD-10-CM | POA: Diagnosis not present

## 2020-12-17 DIAGNOSIS — M9903 Segmental and somatic dysfunction of lumbar region: Secondary | ICD-10-CM | POA: Diagnosis not present

## 2020-12-18 DIAGNOSIS — M9905 Segmental and somatic dysfunction of pelvic region: Secondary | ICD-10-CM | POA: Diagnosis not present

## 2020-12-18 DIAGNOSIS — M531 Cervicobrachial syndrome: Secondary | ICD-10-CM | POA: Diagnosis not present

## 2020-12-18 DIAGNOSIS — M5417 Radiculopathy, lumbosacral region: Secondary | ICD-10-CM | POA: Diagnosis not present

## 2020-12-18 DIAGNOSIS — M9903 Segmental and somatic dysfunction of lumbar region: Secondary | ICD-10-CM | POA: Diagnosis not present

## 2020-12-25 DIAGNOSIS — M9903 Segmental and somatic dysfunction of lumbar region: Secondary | ICD-10-CM | POA: Diagnosis not present

## 2020-12-25 DIAGNOSIS — M5417 Radiculopathy, lumbosacral region: Secondary | ICD-10-CM | POA: Diagnosis not present

## 2020-12-25 DIAGNOSIS — M9905 Segmental and somatic dysfunction of pelvic region: Secondary | ICD-10-CM | POA: Diagnosis not present

## 2020-12-25 DIAGNOSIS — M531 Cervicobrachial syndrome: Secondary | ICD-10-CM | POA: Diagnosis not present

## 2020-12-27 DIAGNOSIS — M9903 Segmental and somatic dysfunction of lumbar region: Secondary | ICD-10-CM | POA: Diagnosis not present

## 2020-12-27 DIAGNOSIS — M531 Cervicobrachial syndrome: Secondary | ICD-10-CM | POA: Diagnosis not present

## 2020-12-27 DIAGNOSIS — M9905 Segmental and somatic dysfunction of pelvic region: Secondary | ICD-10-CM | POA: Diagnosis not present

## 2020-12-27 DIAGNOSIS — M5417 Radiculopathy, lumbosacral region: Secondary | ICD-10-CM | POA: Diagnosis not present

## 2020-12-31 DIAGNOSIS — M9905 Segmental and somatic dysfunction of pelvic region: Secondary | ICD-10-CM | POA: Diagnosis not present

## 2020-12-31 DIAGNOSIS — M5417 Radiculopathy, lumbosacral region: Secondary | ICD-10-CM | POA: Diagnosis not present

## 2020-12-31 DIAGNOSIS — M531 Cervicobrachial syndrome: Secondary | ICD-10-CM | POA: Diagnosis not present

## 2020-12-31 DIAGNOSIS — M9903 Segmental and somatic dysfunction of lumbar region: Secondary | ICD-10-CM | POA: Diagnosis not present

## 2021-01-03 DIAGNOSIS — N393 Stress incontinence (female) (male): Secondary | ICD-10-CM | POA: Diagnosis not present

## 2021-01-03 DIAGNOSIS — Z01419 Encounter for gynecological examination (general) (routine) without abnormal findings: Secondary | ICD-10-CM | POA: Diagnosis not present

## 2021-01-03 DIAGNOSIS — Z6833 Body mass index (BMI) 33.0-33.9, adult: Secondary | ICD-10-CM | POA: Diagnosis not present

## 2021-01-04 DIAGNOSIS — M9903 Segmental and somatic dysfunction of lumbar region: Secondary | ICD-10-CM | POA: Diagnosis not present

## 2021-01-04 DIAGNOSIS — M9905 Segmental and somatic dysfunction of pelvic region: Secondary | ICD-10-CM | POA: Diagnosis not present

## 2021-01-04 DIAGNOSIS — M531 Cervicobrachial syndrome: Secondary | ICD-10-CM | POA: Diagnosis not present

## 2021-01-04 DIAGNOSIS — M5417 Radiculopathy, lumbosacral region: Secondary | ICD-10-CM | POA: Diagnosis not present

## 2021-01-07 ENCOUNTER — Other Ambulatory Visit: Payer: Self-pay | Admitting: Obstetrics and Gynecology

## 2021-01-07 DIAGNOSIS — Z1231 Encounter for screening mammogram for malignant neoplasm of breast: Secondary | ICD-10-CM

## 2021-01-12 ENCOUNTER — Ambulatory Visit
Admission: RE | Admit: 2021-01-12 | Discharge: 2021-01-12 | Disposition: A | Discharge status: Home / Self Care | Payer: BC Managed Care – PPO | Source: Ambulatory Visit | Attending: Obstetrics and Gynecology | Admitting: Obstetrics and Gynecology

## 2021-01-12 DIAGNOSIS — Z1231 Encounter for screening mammogram for malignant neoplasm of breast: Secondary | ICD-10-CM

## 2021-01-16 DIAGNOSIS — M9905 Segmental and somatic dysfunction of pelvic region: Secondary | ICD-10-CM | POA: Diagnosis not present

## 2021-01-16 DIAGNOSIS — M5417 Radiculopathy, lumbosacral region: Secondary | ICD-10-CM | POA: Diagnosis not present

## 2021-01-16 DIAGNOSIS — M9903 Segmental and somatic dysfunction of lumbar region: Secondary | ICD-10-CM | POA: Diagnosis not present

## 2021-01-16 DIAGNOSIS — M531 Cervicobrachial syndrome: Secondary | ICD-10-CM | POA: Diagnosis not present

## 2021-04-18 DIAGNOSIS — B9689 Other specified bacterial agents as the cause of diseases classified elsewhere: Secondary | ICD-10-CM | POA: Diagnosis not present

## 2021-04-18 DIAGNOSIS — J0100 Acute maxillary sinusitis, unspecified: Secondary | ICD-10-CM | POA: Diagnosis not present

## 2021-04-18 DIAGNOSIS — R0981 Nasal congestion: Secondary | ICD-10-CM | POA: Diagnosis not present

## 2021-11-29 ENCOUNTER — Other Ambulatory Visit: Payer: Self-pay | Admitting: Obstetrics and Gynecology

## 2021-11-29 DIAGNOSIS — Z1231 Encounter for screening mammogram for malignant neoplasm of breast: Secondary | ICD-10-CM

## 2021-12-10 ENCOUNTER — Encounter: Payer: Self-pay | Admitting: Emergency Medicine

## 2021-12-10 ENCOUNTER — Emergency Department: Admit: 2021-12-10 | Discharge status: Absent without leave | Payer: BC Managed Care – PPO

## 2021-12-10 ENCOUNTER — Emergency Department
Admission: EM | Admit: 2021-12-10 | Discharge: 2021-12-10 | Disposition: A | Discharge status: Home / Self Care | Payer: 59 | Attending: Family Medicine | Admitting: Family Medicine

## 2021-12-10 DIAGNOSIS — J0100 Acute maxillary sinusitis, unspecified: Secondary | ICD-10-CM

## 2021-12-10 DIAGNOSIS — J3489 Other specified disorders of nose and nasal sinuses: Secondary | ICD-10-CM | POA: Diagnosis not present

## 2021-12-10 DIAGNOSIS — J309 Allergic rhinitis, unspecified: Secondary | ICD-10-CM | POA: Diagnosis not present

## 2021-12-10 MED ORDER — FEXOFENADINE HCL 180 MG PO TABS: 180.0000 mg | ORAL_TABLET | Freq: Every day | ORAL | 0 refills | Status: AC

## 2021-12-10 MED ORDER — PREDNISONE 20 MG PO TABS: ORAL_TABLET | ORAL | 0 refills | Status: AC

## 2021-12-10 MED ORDER — AMOXICILLIN-POT CLAVULANATE 875-125 MG PO TABS: 1.0000 | ORAL_TABLET | Freq: Two times a day (BID) | ORAL | 0 refills | Status: AC

## 2021-12-10 NOTE — ED Provider Notes (Signed)
[facility]    CSN: [id number] Arrival date & time: 12/10/21  1732      History   Chief Complaint Chief Complaint  Patient presents with   Facial Pain    HPI Erica Callahan is a 51 y.o. female.   HPI 52-year-old female presents with sinus pressure for 9 days.  Reports using OTC saline nasal spray and Afrin for 3 days.  PMH significant for sinusitis and chronic right hip pain.  History reviewed. No pertinent past medical history.  Patient Active Problem List   Diagnosis Date Noted   VIRAL URI 01/04/2009   SINUSITIS 09/05/2008   ACUTE MAXILLARY SINUSITIS 06/24/2008   HIP PAIN, RIGHT, CHRONIC 02/17/2008    Past Surgical History:  Procedure Laterality Date   CESAREAN SECTION  2005    OB History   No obstetric history on file.      Home Medications    Prior to Admission medications   Medication Sig Start Date End Date Taking? Authorizing Provider  amoxicillin-clavulanate (AUGMENTIN) 875-125 MG tablet Take 1 tablet by mouth 2 (two) times daily for 10 days. 12/10/21 12/20/21 Yes Trevor Iha, FNP  fexofenadine [facility] ALLERGY) 180 MG tablet Take 1 tablet (180 mg total) by mouth daily for 15 days. 12/10/21 12/25/21 Yes Trevor Iha, FNP  predniSONE (DELTASONE) 20 MG tablet Take 3 tabs PO daily x 5 days. 12/10/21  Yes Trevor Iha, FNP  ALPRAZolam Prudy Feeler) 0.25 MG tablet Take 1 tablet by mouth every 8 (eight) hours as needed.    [provider]  B Complex-C (SUPER B COMPLEX PO) Take 1 tablet by mouth every evening.    [provider]  LO LOESTRIN FE 1 MG-10 MCG / 10 MCG tablet Take 1 tablet by mouth daily. 11/21/21   [provider]  mometasone (NASONEX) 50 MCG/ACT nasal spray Place 1-2 sprays into the nose every evening.    [provider]  Multiple Vitamin (MULTIVITAMIN) tablet Take 1 tablet by mouth daily.      [provider]  sertraline (ZOLOFT) 100 MG tablet Take 150 mg by mouth at bedtime.     [provider]  valACYclovir (VALTREX) 1000 MG tablet Take 1,000 mg by mouth daily as needed.    [provider]    Family History Family History  Problem Relation Age of Onset   Kidney cancer Mother    Healthy Father     Social History Social History   Tobacco Use   Smoking status: Former    Types: Cigarettes    Quit date: 2015    Years since quitting: 8.5    Passive exposure: Never   Smokeless tobacco: Never  Vaping Use   Vaping Use: Former   Quit date: 05/20/2019  Substance Use Topics   Alcohol use: Not Currently   Drug use: Never     Allergies   Metronidazole   Review of Systems Review of Systems  HENT:  Positive for sinus pressure and sinus pain.      Physical Exam Triage Vital Signs ED Triage Vitals  Enc Vitals Group     BP 12/10/21 1750 127/90     Pulse Rate 12/10/21 1750 86     Resp 12/10/21 1750 16     Temp 12/10/21 1750 99.9 F (37.7 C)     Temp Source 12/10/21 1750 Oral     SpO2 12/10/21 1750 97 %     Weight 12/10/21 1751 220 lb (99.8 kg)     Height  12/10/21 1751 5' 8.5" (1.74 m)     Head Circumference --      Peak Flow --      Pain Score 12/10/21 1750 4     Pain Loc --      Pain Edu? --      Excl. in GC? --    No data found.  Updated Vital Signs BP 127/90 (BP Location: Left Arm)   Pulse 86   Temp 99.9 F (37.7 C) (Oral)   Resp 16   Ht 5' 8.5" (1.74 m)   Wt 220 lb (99.8 kg)   LMP 11/26/2021 (Exact Date)   SpO2 97%   BMI 32.96 kg/m       Physical Exam Vitals and nursing note reviewed.  Constitutional:      General: She is not in acute distress.    Appearance: Normal appearance. She is obese. She is ill-appearing.  HENT:     Head: Normocephalic and atraumatic.     Right Ear: Tympanic membrane and external ear normal.     Left Ear: Tympanic membrane and external ear normal.     Ears:     Comments: Moderate to significant eustachian tube dysfunction noted bilaterally    Nose:     Right Sinus: Maxillary sinus tenderness  present.     Left Sinus: Maxillary sinus tenderness present.     Comments: Turbinates are erythematous/edematous    Mouth/Throat:     Mouth: Mucous membranes are moist.     Pharynx: Oropharynx is clear.     Comments: Significant amount of clear drainage of posterior oropharynx noted Eyes:     Extraocular Movements: Extraocular movements intact.     Conjunctiva/sclera: Conjunctivae normal.     Pupils: Pupils are equal, round, and reactive to light.  Cardiovascular:     Rate and Rhythm: Normal rate and regular rhythm.     Pulses: Normal pulses.     Heart sounds: Normal heart sounds. No murmur heard. Pulmonary:     Effort: Pulmonary effort is normal.     Breath sounds: Normal breath sounds. No wheezing, rhonchi or rales.  Musculoskeletal:     Cervical back: Normal range of motion and neck supple.  Skin:    General: Skin is warm and dry.  Neurological:     General: No focal deficit present.     Mental Status: She is alert and oriented to person, place, and time.      UC Treatments / Results  Labs (all labs ordered are listed, but only abnormal results are displayed) Labs Reviewed - No data to display  EKG   Radiology No results found.  Procedures Procedures (including critical care time)  Medications Ordered in UC Medications - No data to display  Initial Impression / Assessment and Plan / UC Course  I have reviewed the triage vital signs and the nursing notes.  Pertinent labs & imaging results that were available during my care of the patient were reviewed by me and considered in my medical decision making (see chart for details).     MDM: 1.  Acute maxillary sinusitis, recurrence-Rx'd Augmentin; 2.  Sinus pressure-Rx'd prednisone; 3.  Allergic rhinitis-Rx'd Allegra. Advised patient to take medication as directed with food to completion.  Instructed patient to take prednisone and Allegra with first dose of Augmentin for the next 5 of 10 days.  Advised may use Allegra  as needed afterwards for concurrent postnasal drainage/drip.  Encouraged patient to increase daily water intake while taking these medications.  Advised patient if symptoms worsen and/or unresolved please follow-up with PCP or here for further evaluation.  Patient discharged home, hemodynamically stable. Final Clinical Impressions(s) / UC Diagnoses   Final diagnoses:  Acute maxillary sinusitis, recurrence not specified  Sinus pressure  Allergic rhinitis, unspecified seasonality, unspecified trigger     Discharge Instructions      Advised patient to take medication as directed with food to completion.  Instructed patient to take prednisone and Allegra with first dose of Augmentin for the next 5 of 10 days.  Advised may use Allegra as needed afterwards for concurrent postnasal drainage/drip.  Encouraged patient to increase daily water intake while taking these medications.  Advised patient if symptoms worsen and/or unresolved please follow-up with PCP or here for further evaluation.     ED Prescriptions     Medication Sig Dispense Auth. Provider   amoxicillin-clavulanate (AUGMENTIN) 875-125 MG tablet Take 1 tablet by mouth 2 (two) times daily for 10 days. 20 tablet Trevor Iha, FNP   predniSONE (DELTASONE) 20 MG tablet Take 3 tabs PO daily x 5 days. 15 tablet Trevor Iha, FNP   fexofenadine [facility] ALLERGY) 180 MG tablet Take 1 tablet (180 mg total) by mouth daily for 15 days. 15 tablet Trevor Iha, FNP      PDMP not reviewed this encounter.   Trevor Iha, FNP 12/10/21 1825

## 2021-12-10 NOTE — ED Triage Notes (Signed)
Sinus pressure x 9 days  OTC saline nasal spray , afrin x 3 days Negative home COVID test yesterday  Headache  No flonase Felt feverish this afternoon

## 2021-12-10 NOTE — Discharge Instructions (Addendum)
Advised patient to take medication as directed with food to completion.  Instructed patient to take prednisone and Allegra with first dose of Augmentin for the next 5 of 10 days.  Advised may use Allegra as needed afterwards for concurrent postnasal drainage/drip.  Encouraged patient to increase daily water intake while taking these medications.  Advised patient if symptoms worsen and/or unresolved please follow-up with PCP or here for further evaluation. 

## 2022-01-14 ENCOUNTER — Ambulatory Visit: Payer: Self-pay

## 2022-01-21 ENCOUNTER — Ambulatory Visit
Admission: RE | Admit: 2022-01-21 | Discharge: 2022-01-21 | Disposition: A | Discharge status: Home / Self Care | Payer: 59 | Source: Ambulatory Visit | Attending: Obstetrics and Gynecology | Admitting: Obstetrics and Gynecology

## 2022-01-21 DIAGNOSIS — Z1231 Encounter for screening mammogram for malignant neoplasm of breast: Secondary | ICD-10-CM

## 2022-12-29 IMAGING — MG MM DIGITAL SCREENING BILAT W/ TOMO AND CAD
8 series · 8 of 24 positions shown · non-contrast
Comparison: Previous exam(s).

CLINICAL DATA: Screening.

EXAM:
DIGITAL SCREENING BILATERAL MAMMOGRAM WITH TOMOSYNTHESIS AND CAD
TECHNIQUE: Bilateral screening digital craniocaudal and mediolateral oblique
mammograms were obtained. Bilateral screening digital breast
tomosynthesis was performed. The images were evaluated with
computer-aided detection.

[R CC synth-2D]
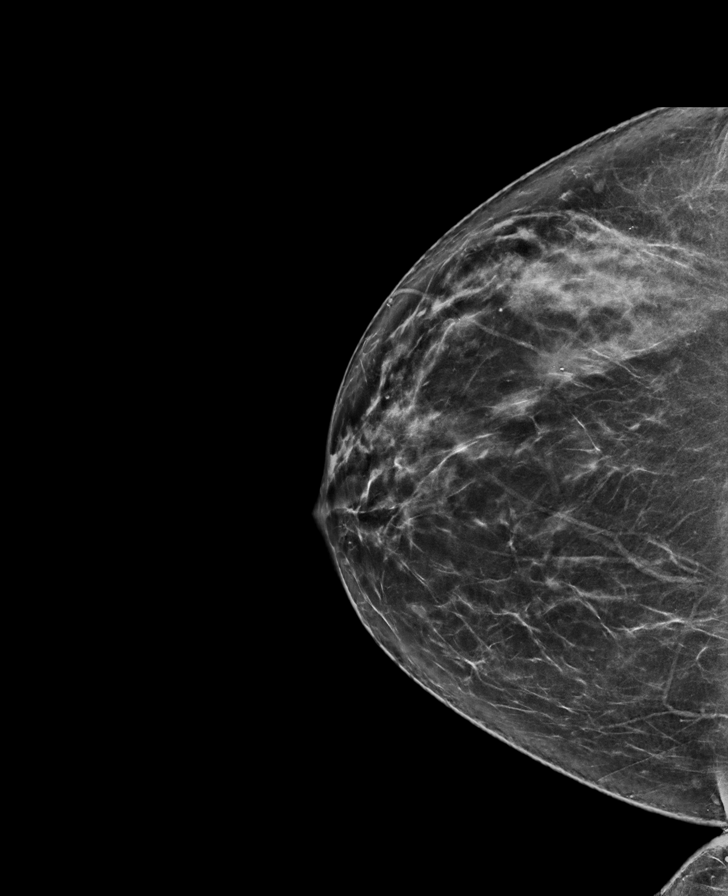

[L MLO synth-2D]
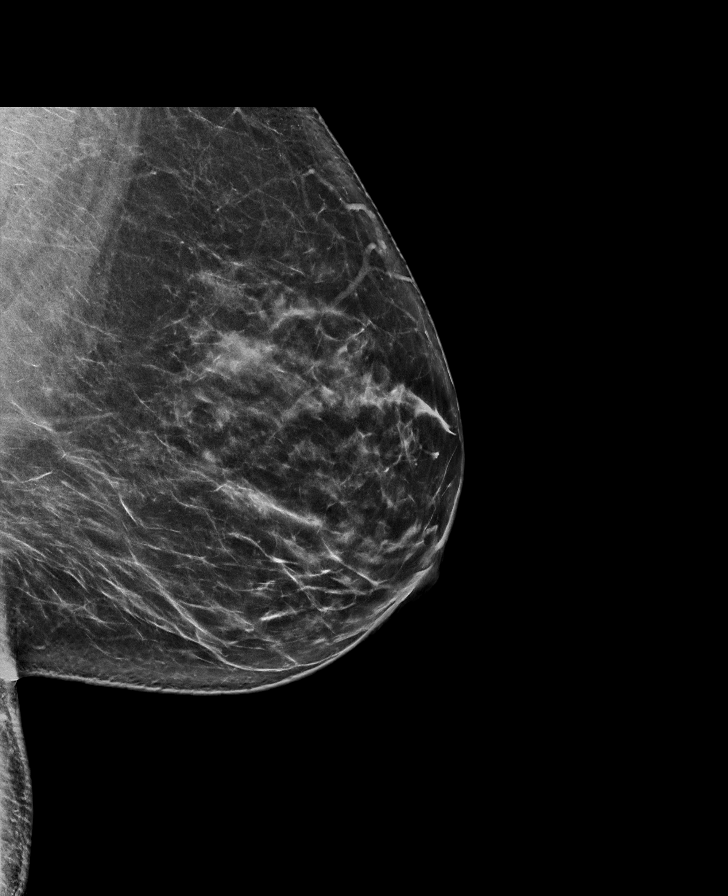

[R MLO synth-2D]
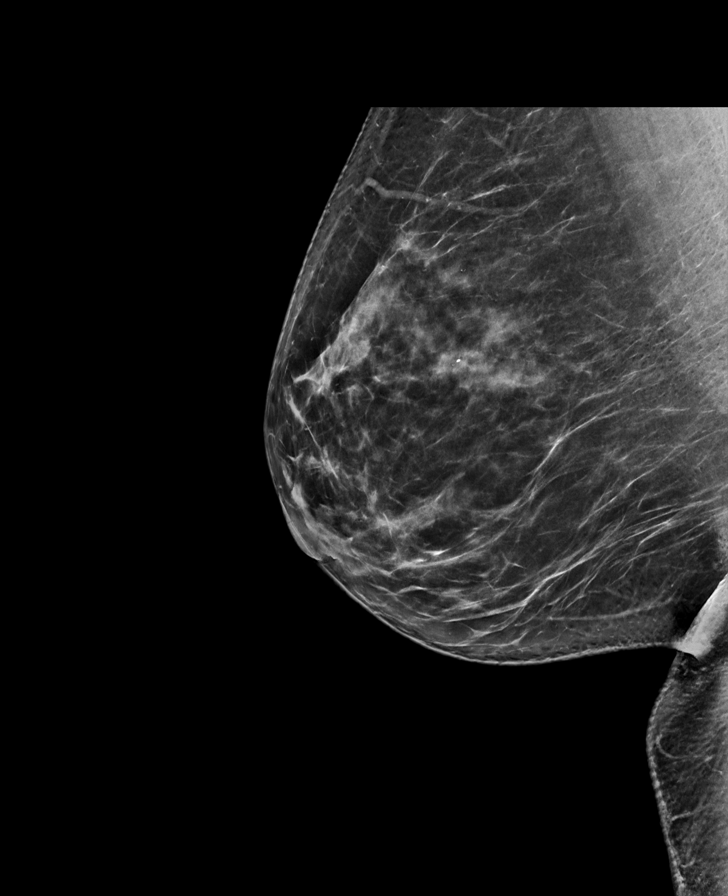

[L CC synth-2D]
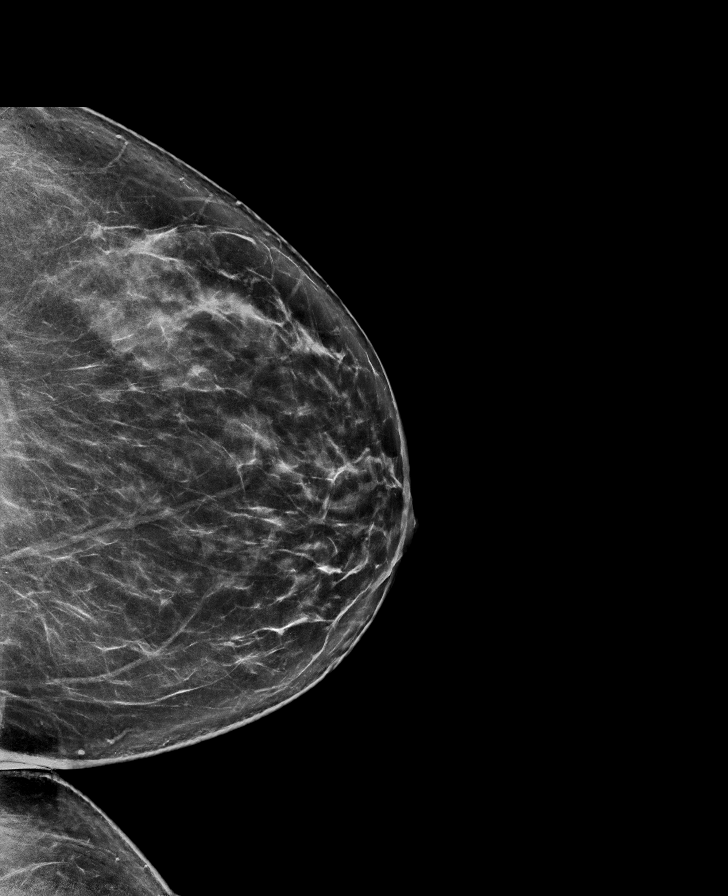

[R CC tomo · tomo slice 39/77.0]
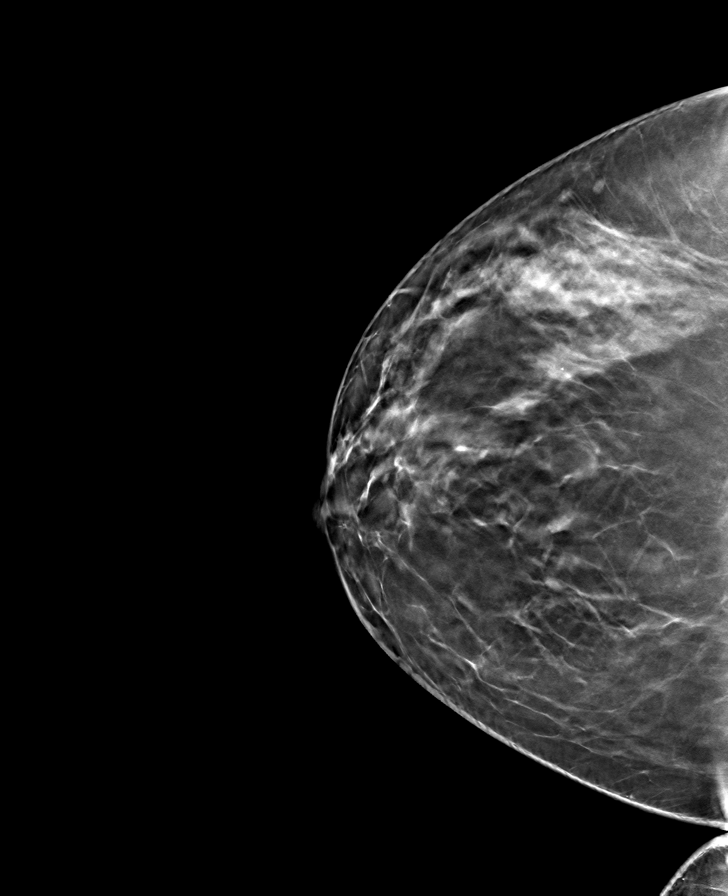

[L CC tomo · tomo slice 41/82.0]
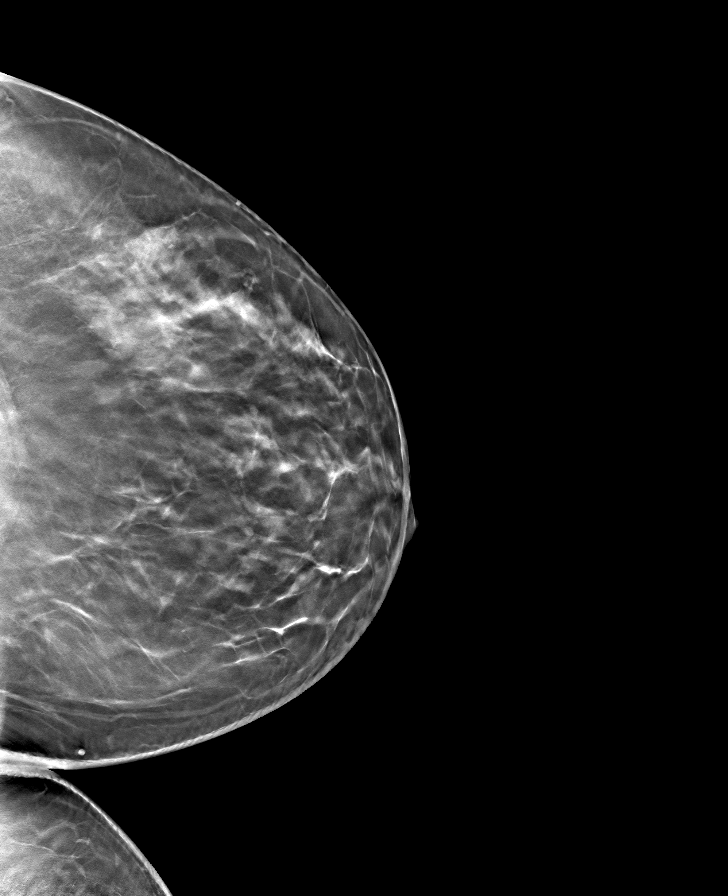

[R MLO tomo · tomo slice 43/86.0]
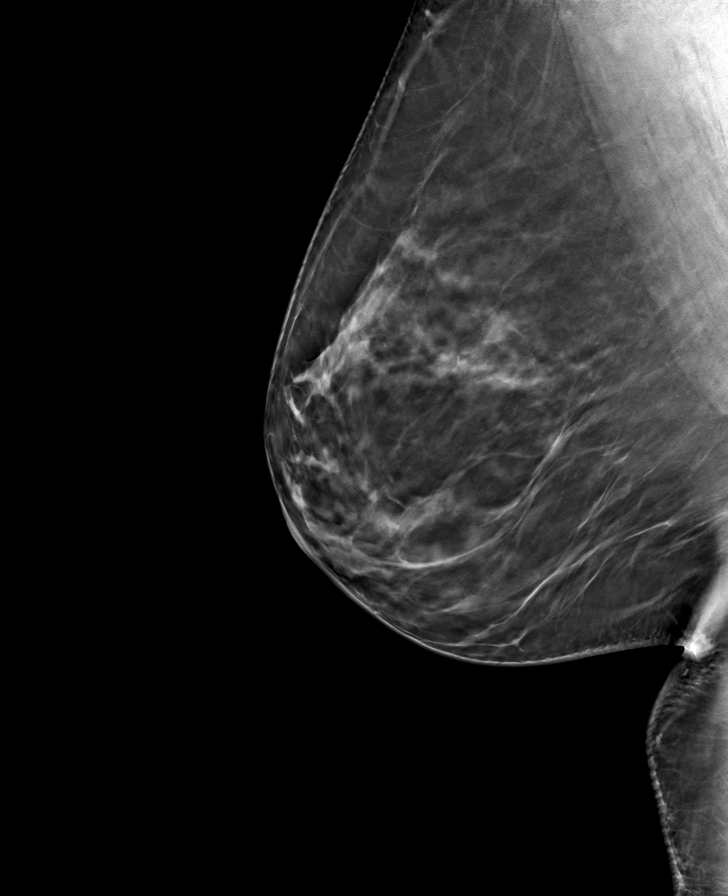

[L MLO tomo · tomo slice 41/82.0]
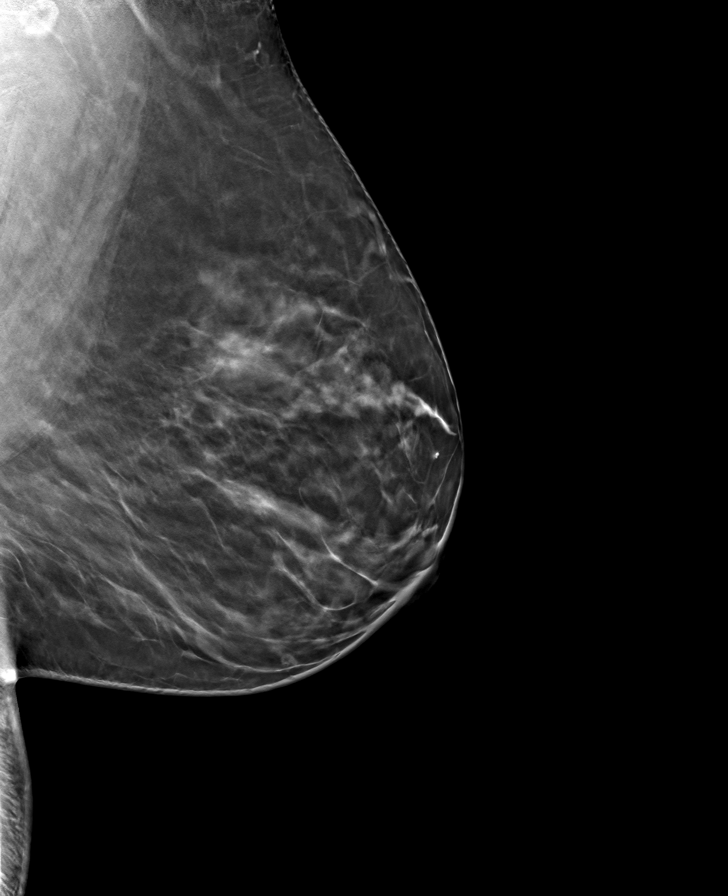

[8 of 24 positions shown; findings below may reference images not displayed]

ACR Breast Density Category b: There are scattered areas of
fibroglandular density.
FINDINGS: There are no findings suspicious for malignancy.
IMPRESSION: No mammographic evidence of malignancy. A result letter of this
screening mammogram will be mailed directly to the patient.

RECOMMENDATION:
Screening mammogram in one year. (Code:51-O-LD2)

BI-RADS CATEGORY  1: Negative.
